# Patient Record
Sex: Female | Born: 1965 | Race: Black or African American | Hispanic: No | Marital: Single | Smoking: Never smoker
Health system: Southern US, Community
[De-identification: ages and names within clinical notes are randomized; demographics above are authoritative.]

## PROBLEM LIST (undated history)

## (undated) DIAGNOSIS — F419 Anxiety disorder, unspecified: Secondary | ICD-10-CM

## (undated) DIAGNOSIS — I82409 Acute embolism and thrombosis of unspecified deep veins of unspecified lower extremity: Secondary | ICD-10-CM

## (undated) DIAGNOSIS — C539 Malignant neoplasm of cervix uteri, unspecified: Secondary | ICD-10-CM

## (undated) HISTORY — PX: LEEP: SHX91

## (undated) HISTORY — PX: HERNIA REPAIR: SHX51

## (undated) HISTORY — PX: ABDOMINAL HYSTERECTOMY: SHX81

---

## 2014-03-18 ENCOUNTER — Encounter (HOSPITAL_BASED_OUTPATIENT_CLINIC_OR_DEPARTMENT_OTHER): Payer: Self-pay | Admitting: *Deleted

## 2014-03-18 ENCOUNTER — Emergency Department (HOSPITAL_BASED_OUTPATIENT_CLINIC_OR_DEPARTMENT_OTHER)
Admission: EM | Admit: 2014-03-18 | Discharge: 2014-03-18 | Disposition: A | Discharge status: Home / Self Care | Payer: Medicaid Other | Attending: Emergency Medicine | Admitting: Emergency Medicine

## 2014-03-18 DIAGNOSIS — F419 Anxiety disorder, unspecified: Secondary | ICD-10-CM | POA: Insufficient documentation

## 2014-03-18 DIAGNOSIS — Z8541 Personal history of malignant neoplasm of cervix uteri: Secondary | ICD-10-CM | POA: Insufficient documentation

## 2014-03-18 DIAGNOSIS — Z86718 Personal history of other venous thrombosis and embolism: Secondary | ICD-10-CM | POA: Diagnosis not present

## 2014-03-18 DIAGNOSIS — Z7901 Long term (current) use of anticoagulants: Secondary | ICD-10-CM | POA: Insufficient documentation

## 2014-03-18 DIAGNOSIS — Z79899 Other long term (current) drug therapy: Secondary | ICD-10-CM | POA: Diagnosis not present

## 2014-03-18 HISTORY — DX: Anxiety disorder, unspecified: F41.9

## 2014-03-18 HISTORY — DX: Acute embolism and thrombosis of unspecified deep veins of unspecified lower extremity: I82.409

## 2014-03-18 HISTORY — DX: Malignant neoplasm of cervix uteri, unspecified: C53.9

## 2014-03-18 MED ORDER — CLONAZEPAM 0.5 MG PO TABS: 0.5000 mg | ORAL_TABLET | Freq: Two times a day (BID) | ORAL | Status: AC | PRN

## 2014-03-18 MED ORDER — LORAZEPAM 1 MG PO TABS
1.0000 mg | ORAL_TABLET | Freq: Once | ORAL | Status: AC
  Administered 2014-03-18: 1 mg via ORAL
  Filled 2014-03-18: qty 1

## 2014-03-18 NOTE — ED Notes (Signed)
Pt reports anxiety attack since New Years Eve- out of klonopin- denies SI/HI- states trying to regulate her breathing

## 2014-03-18 NOTE — Discharge Instructions (Signed)
Panic Attacks Panic attacks are sudden, short-livedsurges of severe anxiety, fear, or discomfort. They may occur for no reason when you are relaxed, when you are anxious, or when you are sleeping. Panic attacks may occur for a number of reasons:   Healthy people occasionally have panic attacks in extreme, life-threatening situations, such as war or natural disasters. Normal anxiety is a protective mechanism of the body that helps us react to danger (fight or flight response).  Panic attacks are often seen with anxiety disorders, such as panic disorder, social anxiety disorder, generalized anxiety disorder, and phobias. Anxiety disorders cause excessive or uncontrollable anxiety. They may interfere with your relationships or other life activities.  Panic attacks are sometimes seen with other mental illnesses, such as depression and posttraumatic stress disorder.  Certain medical conditions, prescription medicines, and drugs of abuse can cause panic attacks. SYMPTOMS  Panic attacks start suddenly, peak within 20 minutes, and are accompanied by four or more of the following symptoms:  Pounding heart or fast heart rate (palpitations).  Sweating.  Trembling or shaking.  Shortness of breath or feeling smothered.  Feeling choked.  Chest pain or discomfort.  Nausea or strange feeling in your stomach.  Dizziness, light-headedness, or feeling like you will faint.  Chills or hot flushes.  Numbness or tingling in your lips or hands and feet.  Feeling that things are not real or feeling that you are not yourself.  Fear of losing control or going crazy.  Fear of dying. Some of these symptoms can mimic serious medical conditions. For example, you may think you are having a heart attack. Although panic attacks can be very scary, they are not life threatening. DIAGNOSIS  Panic attacks are diagnosed through an assessment by your health care provider. Your health care provider will ask  questions about your symptoms, such as where and when they occurred. Your health care provider will also ask about your medical history and use of alcohol and drugs, including prescription medicines. Your health care provider may order blood tests or other studies to rule out a serious medical condition. Your health care provider may refer you to a mental health professional for further evaluation. TREATMENT   Most healthy people who have one or two panic attacks in an extreme, life-threatening situation will not require treatment.  The treatment for panic attacks associated with anxiety disorders or other mental illness typically involves counseling with a mental health professional, medicine, or a combination of both. Your health care provider will help determine what treatment is best for you.  Panic attacks due to physical illness usually go away with treatment of the illness. If prescription medicine is causing panic attacks, talk with your health care provider about stopping the medicine, decreasing the dose, or substituting another medicine.  Panic attacks due to alcohol or drug abuse go away with abstinence. Some adults need professional help in order to stop drinking or using drugs. HOME CARE INSTRUCTIONS   Take all medicines as directed by your health care provider.   Schedule and attend follow-up visits as directed by your health care provider. It is important to keep all your appointments. SEEK MEDICAL CARE IF:  You are not able to take your medicines as prescribed.  Your symptoms do not improve or get worse. SEEK IMMEDIATE MEDICAL CARE IF:   You experience panic attack symptoms that are different than your usual symptoms.  You have serious thoughts about hurting yourself or others.  You are taking medicine for panic attacks and   have a serious side effect. MAKE SURE YOU:  Understand these instructions.  Will watch your condition.  Will get help right away if you are not  doing well or get worse. Document Released: 03/03/2005 Document Revised: 03/08/2013 Document Reviewed: 10/15/2012 Exit[employer] Patient Information 2015 [employer]. This information is not intended to replace advice given to you by your health care provider. Make sure you discuss any questions you have with your health care provider.  Generalized Anxiety Disorder Generalized anxiety disorder (GAD) is a mental disorder. It interferes with life functions, including relationships, work, and school. GAD is different from normal anxiety, which everyone experiences at some point in their lives in response to specific life events and activities. Normal anxiety actually helps us prepare for and get through these life events and activities. Normal anxiety goes away after the event or activity is over.  GAD causes anxiety that is not necessarily related to specific events or activities. It also causes excess anxiety in proportion to specific events or activities. The anxiety associated with GAD is also difficult to control. GAD can vary from mild to severe. People with severe GAD can have intense waves of anxiety with physical symptoms (panic attacks).  SYMPTOMS The anxiety and worry associated with GAD are difficult to control. This anxiety and worry are related to many life events and activities and also occur more days than not for 6 months or longer. People with GAD also have three or more of the following symptoms (one or more in children):  Restlessness.   Fatigue.  Difficulty concentrating.   Irritability.  Muscle tension.  Difficulty sleeping or unsatisfying sleep. DIAGNOSIS GAD is diagnosed through an assessment by your health care provider. Your health care provider will ask you questions aboutyour mood,physical symptoms, and events in your life. Your health care provider may ask you about your medical history and use of alcohol or drugs, including prescription medicines. Your health care  provider may also do a physical exam and blood tests. Certain medical conditions and the use of certain substances can cause symptoms similar to those associated with GAD. Your health care provider may refer you to a mental health specialist for further evaluation. TREATMENT The following therapies are usually used to treat GAD:   Medication. Antidepressant medication usually is prescribed for long-term daily control. Antianxiety medicines may be added in severe cases, especially when panic attacks occur.   Talk therapy (psychotherapy). Certain types of talk therapy can be helpful in treating GAD by providing support, education, and guidance. A form of talk therapy called cognitive behavioral therapy can teach you healthy ways to think about and react to daily life events and activities.  Stress managementtechniques. These include yoga, meditation, and exercise and can be very helpful when they are practiced regularly. A mental health specialist can help determine which treatment is best for you. Some people see improvement with one therapy. However, other people require a combination of therapies. Document Released: 06/28/2012 Document Revised: 07/18/2013 Document Reviewed: 06/28/2012 Exit[employer] Patient Information 2015 [employer]. This information is not intended to replace advice given to you by your health care provider. Make sure you discuss any questions you have with your health care provider.  

## 2014-03-18 NOTE — ED Provider Notes (Signed)
CSN: [id number]     Arrival date & time 03/18/14  1228 History   First MD Initiated Contact with Patient 03/18/14 1241     Chief Complaint  Patient presents with  . Anxiety     (Consider location/radiation/quality/duration/timing/severity/associated sxs/prior Treatment) HPI  49 year old female with a past history of anxiety presents with a recurrent anxiety attacks over the last 2 days. She last used her Klonopin 3 days ago. She typically takes this at least one time per day. Her dose is 0.5 mg as needed. The patient has a long-standing history of anxiety, stating she's had anxiety since she was 49 years old. The patient has her typical symptoms of an anxiety attack including tachypnea and chest tightness. There is no thoughts of hurting herself or hurting someone else. The symptoms always accompany her anxiety attacks and she has them quite frequently. This is no different than typical anxiety attack. She does have a history of DVT and has been taking a Xarelto as directed. No seizures but states she has felt a little shaky. When her anxiety gets this bad she has to go to the ER, occasionally admitted.  Past Medical History  Diagnosis Date  . Anxiety   . 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 Cervical cancer    Past Surgical History  Procedure Laterality Date  . Hernia repair    . Cesarean section    . Leep     No family history on file. History  Substance Use Topics  . Smoking status: Never Smoker   . Smokeless tobacco: Never Used  . Alcohol Use: No   OB History    No data available     Review of Systems  Respiratory: Positive for chest tightness and shortness of breath.   Cardiovascular: Negative for leg swelling.  Psychiatric/Behavioral: Negative for suicidal ideas and self-injury. The patient is nervous/anxious.   All other systems reviewed and are negative.     Allergies  Aspirin and Codeine  Home Medications   Prior to Admission medications   Medication  Sig Start Date End Date Taking? Authorizing Provider  clonazePAM (KLONOPIN) 0.5 MG tablet Take 0.5 mg by mouth daily.   Yes Historical Provider, MD  Rivaroxaban (XARELTO PO) Take by mouth.   Yes Historical Provider, MD   BP 134/90 mmHg  Pulse 84  Temp(Src) 98.3 F (36.8 C) (Oral)  Resp 18  Ht 5\' 7"  (1.702 m)  Wt 145 lb (65.772 kg)  BMI 22.71 kg/m2  SpO2 100%  LMP 03/06/2014 Physical Exam  Constitutional: She is oriented to person, place, and time. She appears well-developed and well-nourished.  HENT:  Head: Normocephalic and atraumatic.  Right Ear: External ear normal.  Left Ear: External ear normal.  Nose: Nose normal.  Eyes: Right eye exhibits no discharge. Left eye exhibits no discharge.  Cardiovascular: Normal rate, regular rhythm and normal heart sounds.   Pulmonary/Chest: Effort normal and breath sounds normal.  Abdominal: Soft. She exhibits no distension. There is no tenderness.  Neurological: She is alert and oriented to person, place, and time.  No significant tremor noted  Skin: Skin is warm and dry. She is not diaphoretic.  Nursing note and vitals reviewed.   ED Course  Procedures (including critical care time) Labs Review Labs Reviewed - No data to display  Imaging Review No results found.   EKG Interpretation   Date/Time:  Saturday March 18 2014 12:44:42 EST Ventricular Rate:  71 PR Interval:  132 QRS Duration: 82 QT  Interval:  376 QTC Calculation: 408 R Axis:   68 Text Interpretation:  Normal sinus rhythm with sinus arrhythmia Normal ECG  No old tracing to compare Confirmed by Jacquelyn Shadrick  MD, Andalyn Heckstall ([id number]) on  03/18/2014 12:47:15 PM      MDM   Final diagnoses:  Anxiety    Patient's symptoms significantly improved after one dose of oral Ativan. Patient does endorse chest tightness and shortness of breath but states this is happened to her many times before and is from her chronic anxiety. With a normal EKG, normal heart rate, and no hypoxia I  have very low suspicion for ACS or pulmonary embolism. This is also less likely given Ativan reduced all of her symptoms tended gone. Will give small refill of Klonopin to help prevent withdrawals but stressed the importance of following up with her PCP who writes these chronically.    Ephraim Hamburger, MD 03/18/14 1426

## 2016-11-26 ENCOUNTER — Encounter (HOSPITAL_BASED_OUTPATIENT_CLINIC_OR_DEPARTMENT_OTHER): Payer: Self-pay | Admitting: *Deleted

## 2016-11-26 ENCOUNTER — Emergency Department (HOSPITAL_BASED_OUTPATIENT_CLINIC_OR_DEPARTMENT_OTHER)
Admission: EM | Admit: 2016-11-26 | Discharge: 2016-11-26 | Disposition: A | Discharge status: Home / Self Care | Payer: Medicaid Other | Attending: Emergency Medicine | Admitting: Emergency Medicine

## 2016-11-26 ENCOUNTER — Emergency Department (HOSPITAL_BASED_OUTPATIENT_CLINIC_OR_DEPARTMENT_OTHER): Payer: Medicaid Other

## 2016-11-26 DIAGNOSIS — S99921A Unspecified injury of right foot, initial encounter: Secondary | ICD-10-CM | POA: Diagnosis present

## 2016-11-26 DIAGNOSIS — Y9301 Activity, walking, marching and hiking: Secondary | ICD-10-CM | POA: Diagnosis not present

## 2016-11-26 DIAGNOSIS — Z79899 Other long term (current) drug therapy: Secondary | ICD-10-CM | POA: Insufficient documentation

## 2016-11-26 DIAGNOSIS — Y999 Unspecified external cause status: Secondary | ICD-10-CM | POA: Insufficient documentation

## 2016-11-26 DIAGNOSIS — S93601A Unspecified sprain of right foot, initial encounter: Secondary | ICD-10-CM | POA: Diagnosis not present

## 2016-11-26 DIAGNOSIS — Y929 Unspecified place or not applicable: Secondary | ICD-10-CM | POA: Insufficient documentation

## 2016-11-26 DIAGNOSIS — W228XXA Striking against or struck by other objects, initial encounter: Secondary | ICD-10-CM | POA: Insufficient documentation

## 2016-11-26 DIAGNOSIS — Z8541 Personal history of malignant neoplasm of cervix uteri: Secondary | ICD-10-CM | POA: Insufficient documentation

## 2016-11-26 MED ORDER — IBUPROFEN 800 MG PO TABS
800.0000 mg | ORAL_TABLET | Freq: Once | ORAL | Status: AC
  Administered 2016-11-26: 800 mg via ORAL
  Filled 2016-11-26: qty 1

## 2016-11-26 MED ORDER — ACETAMINOPHEN 500 MG PO TABS
1000.0000 mg | ORAL_TABLET | Freq: Once | ORAL | Status: AC
  Administered 2016-11-26: 1000 mg via ORAL
  Filled 2016-11-26: qty 2

## 2016-11-26 NOTE — ED Triage Notes (Signed)
Pt reports she injured her right foot cleaning this morning. +deformity right 5th toe. Swelling to right foot noted. Pt reports she has had a blood clot in her foot in the past

## 2016-11-26 NOTE — Discharge Instructions (Signed)
Take 4 over the counter ibuprofen tablets 3 times a day or 2 over-the-counter naproxen tablets twice a day for pain. Also take tylenol 1000mg(2 extra strength) four times a day.    

## 2016-11-26 NOTE — ED Provider Notes (Signed)
[facility] Provider Note   CSN: [id number] Arrival date & time: 11/26/16  6387     History   Chief Complaint Chief Complaint  Patient presents with  . Foot Injury    HPI Julie Newman is a 51 y.o. female.  51 yo F with a cc of right lateral foot injury.  Stubbed on furniture this morning.  Denies other injury.  Pain with ambulation, sharp shooting.  Denies radiation.  She was concerned because she had a blood clot in this leg previously.    The history is provided by the patient.  Foot Injury   The incident occurred less than 1 hour ago. The incident occurred at work. The injury mechanism was a direct blow. The pain is present in the right foot. The quality of the pain is described as sharp and throbbing. The pain is at a severity of 9/10. The pain is moderate. The pain has been constant since onset. Pertinent negatives include no numbness, no inability to bear weight, no loss of motion and no muscle weakness. She reports no foreign bodies present. Nothing aggravates the symptoms. She has tried nothing for the symptoms. The treatment provided no relief.    Past Medical History:  Diagnosis Date  . Anxiety   . Cervical cancer ([place])   . DVT of leg (deep venous thrombosis) ([place]) right    There are no active problems to display for this patient.   Past Surgical History:  Procedure Laterality Date  . CESAREAN SECTION    . HERNIA REPAIR    . LEEP      OB History    No data available       Home Medications    Prior to Admission medications   Medication Sig Start Date End Date Taking? Authorizing Provider  clonazePAM (KLONOPIN) 0.5 MG tablet Take 1 tablet (0.5 mg total) by mouth 2 (two) times daily as needed for anxiety. 03/18/14  Yes Sherwood Gambler, MD  Rivaroxaban (XARELTO PO) Take by mouth.    [provider]    Family History No family history on file.  Social History Social History  Substance Use Topics  . Smoking status: Never Smoker    . Smokeless tobacco: Never Used  . Alcohol use No     Allergies   Aspirin and Codeine   Review of Systems Review of Systems  Constitutional: Negative for chills and fever.  HENT: Negative for congestion and rhinorrhea.   Eyes: Negative for redness and visual disturbance.  Respiratory: Negative for shortness of breath and wheezing.   Cardiovascular: Negative for chest pain and palpitations.  Gastrointestinal: Negative for nausea and vomiting.  Genitourinary: Negative for dysuria and urgency.  Musculoskeletal: Positive for arthralgias and myalgias.  Skin: Negative for pallor and wound.  Neurological: Negative for dizziness, numbness and headaches.     Physical Exam Updated Vital Signs BP (!) 139/91 (BP Location: Right Arm)   Pulse 67   Temp 98.1 F (36.7 C) (Oral)   Resp 18   LMP 03/06/2014   SpO2 100%   Physical Exam  Constitutional: She is oriented to person, place, and time. She appears well-developed and well-nourished. No distress.  HENT:  Head: Normocephalic and atraumatic.  Eyes: Pupils are equal, round, and reactive to light. EOM are normal.  Neck: Normal range of motion. Neck supple.  Cardiovascular: Normal rate and regular rhythm.  Exam reveals no gallop and no friction rub.   No murmur heard. Pulmonary/Chest: Effort normal. She has no wheezes.  She has no rales.  Abdominal: Soft. She exhibits no distension and no mass. There is no tenderness. There is no guarding.  Musculoskeletal: She exhibits tenderness. She exhibits no edema.  TTP about the entire R lateral aspect of the foot.  Mild lateral deviation of the fifth digit compared to the other side.  Mild pain about the right lateral malleolus. Trace edema.  PMS intact to the foot.   Neurological: She is alert and oriented to person, place, and time.  Skin: Skin is warm and dry. She is not diaphoretic.  Psychiatric: She has a normal mood and affect. Her behavior is normal.  Nursing note and vitals  reviewed.    ED Treatments / Results  Labs (all labs ordered are listed, but only abnormal results are displayed) Labs Reviewed - No data to display  EKG  EKG Interpretation None       Radiology Dg Ankle Complete Right  Result Date: 11/26/2016 CLINICAL DATA:  Injury fifth toe today EXAM: RIGHT ANKLE - COMPLETE 3+ VIEW COMPARISON:  None. FINDINGS: There is no evidence of fracture, dislocation, or joint effusion. There is no evidence of arthropathy or other focal bone abnormality. Soft tissues are unremarkable. IMPRESSION: Negative. Electronically Signed   By: Franchot Gallo M.D.   On: 11/26/2016 10:59   Dg Foot Complete Right  Result Date: 11/26/2016 CLINICAL DATA:  Injury fifth toe today EXAM: RIGHT FOOT COMPLETE - 3+ VIEW COMPARISON:  None. FINDINGS: There is no evidence of fracture or dislocation. There is no evidence of arthropathy or other focal bone abnormality. Soft tissues are unremarkable. IMPRESSION: Negative. Electronically Signed   By: Franchot Gallo M.D.   On: 11/26/2016 10:59    Procedures Procedures (including critical care time)  Medications Ordered in ED Medications  acetaminophen (TYLENOL) tablet 1,000 mg (1,000 mg Oral Given 11/26/16 1017)  ibuprofen (ADVIL,MOTRIN) tablet 800 mg (800 mg Oral Given 11/26/16 1017)     Initial Impression / Assessment and Plan / ED Course  I have reviewed the triage vital signs and the nursing notes.  Pertinent labs & imaging results that were available during my care of the patient were reviewed by me and considered in my medical decision making (see chart for details).     51 yo F with a cc of right foot pain after stubbing it on something this morning.  Pain with ambulation.   Xray negative. D/c home.   11:06 AM:  I have discussed the diagnosis/risks/treatment options with the patient and family and believe the pt to be eligible for discharge home to follow-up with PCP. We also discussed returning to the ED immediately if  new or worsening sx occur. We discussed the sx which are most concerning (e.g., sudden worsening pain, fever, inability to tolerate by mouth) that necessitate immediate return. Medications administered to the patient during their visit and any new prescriptions provided to the patient are listed below.  Medications given during this visit Medications  acetaminophen (TYLENOL) tablet 1,000 mg (1,000 mg Oral Given 11/26/16 1017)  ibuprofen (ADVIL,MOTRIN) tablet 800 mg (800 mg Oral Given 11/26/16 1017)     The patient appears reasonably screen and/or stabilized for discharge and I doubt any other medical condition or other [facility] requiring further screening, evaluation, or treatment in the ED at this time prior to discharge.    Final Clinical Impressions(s) / ED Diagnoses   Final diagnoses:  Sprain of right foot, initial encounter    New Prescriptions New Prescriptions   No medications  on file     Deno Etienne, DO 11/26/16 1106

## 2019-07-04 ENCOUNTER — Encounter (HOSPITAL_BASED_OUTPATIENT_CLINIC_OR_DEPARTMENT_OTHER): Payer: Self-pay | Admitting: Emergency Medicine

## 2019-07-04 ENCOUNTER — Other Ambulatory Visit: Payer: Self-pay

## 2019-07-04 ENCOUNTER — Emergency Department (HOSPITAL_BASED_OUTPATIENT_CLINIC_OR_DEPARTMENT_OTHER)
Admission: EM | Admit: 2019-07-04 | Discharge: 2019-07-04 | Disposition: A | Discharge status: Home / Self Care | Payer: No Typology Code available for payment source | Attending: Emergency Medicine | Admitting: Emergency Medicine

## 2019-07-04 ENCOUNTER — Emergency Department (HOSPITAL_BASED_OUTPATIENT_CLINIC_OR_DEPARTMENT_OTHER): Payer: No Typology Code available for payment source

## 2019-07-04 DIAGNOSIS — S5002XA Contusion of left elbow, initial encounter: Secondary | ICD-10-CM | POA: Diagnosis not present

## 2019-07-04 DIAGNOSIS — S4992XA Unspecified injury of left shoulder and upper arm, initial encounter: Secondary | ICD-10-CM | POA: Diagnosis present

## 2019-07-04 DIAGNOSIS — Y92410 Unspecified street and highway as the place of occurrence of the external cause: Secondary | ICD-10-CM | POA: Diagnosis not present

## 2019-07-04 DIAGNOSIS — Y999 Unspecified external cause status: Secondary | ICD-10-CM | POA: Insufficient documentation

## 2019-07-04 DIAGNOSIS — S40012A Contusion of left shoulder, initial encounter: Secondary | ICD-10-CM | POA: Diagnosis not present

## 2019-07-04 DIAGNOSIS — Z8541 Personal history of malignant neoplasm of cervix uteri: Secondary | ICD-10-CM | POA: Diagnosis not present

## 2019-07-04 DIAGNOSIS — Y93I9 Activity, other involving external motion: Secondary | ICD-10-CM | POA: Diagnosis not present

## 2019-07-04 NOTE — ED Notes (Signed)
Pt transported to XR.  
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
## 2019-07-04 NOTE — ED Notes (Signed)
ED Provider at bedside. 

## 2019-07-04 NOTE — ED Notes (Signed)
ED Provider at bedside discussing test results and dispo plan of care. 

## 2020-12-01 ENCOUNTER — Other Ambulatory Visit: Payer: Self-pay

## 2020-12-01 ENCOUNTER — Emergency Department (HOSPITAL_BASED_OUTPATIENT_CLINIC_OR_DEPARTMENT_OTHER)
Admission: EM | Admit: 2020-12-01 | Discharge: 2020-12-01 | Discharge status: Absent without leave | Payer: Medicaid Other

## 2020-12-01 NOTE — ED Notes (Signed)
Pt sts she does not want to be seen; she is waiting for her daughter to pick her up
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
## 2022-02-08 ENCOUNTER — Emergency Department (HOSPITAL_BASED_OUTPATIENT_CLINIC_OR_DEPARTMENT_OTHER)
Admission: EM | Admit: 2022-02-08 | Discharge: 2022-02-08 | Disposition: A | Discharge status: Home / Self Care | Payer: Medicaid Other | Attending: Emergency Medicine | Admitting: Emergency Medicine

## 2022-02-08 ENCOUNTER — Other Ambulatory Visit: Payer: Self-pay

## 2022-02-08 ENCOUNTER — Encounter (HOSPITAL_BASED_OUTPATIENT_CLINIC_OR_DEPARTMENT_OTHER): Payer: Self-pay | Admitting: Emergency Medicine

## 2022-02-08 DIAGNOSIS — T25221A Burn of second degree of right foot, initial encounter: Secondary | ICD-10-CM | POA: Insufficient documentation

## 2022-02-08 DIAGNOSIS — Z23 Encounter for immunization: Secondary | ICD-10-CM | POA: Diagnosis not present

## 2022-02-08 DIAGNOSIS — T25021A Burn of unspecified degree of right foot, initial encounter: Secondary | ICD-10-CM | POA: Diagnosis present

## 2022-02-08 DIAGNOSIS — I10 Essential (primary) hypertension: Secondary | ICD-10-CM | POA: Insufficient documentation

## 2022-02-08 DIAGNOSIS — X158XXA Contact with other hot household appliances, initial encounter: Secondary | ICD-10-CM | POA: Diagnosis not present

## 2022-02-08 DIAGNOSIS — T300 Burn of unspecified body region, unspecified degree: Secondary | ICD-10-CM

## 2022-02-08 MED ORDER — SILVER SULFADIAZINE 1 % EX CREA
TOPICAL_CREAM | Freq: Once | CUTANEOUS | Status: AC
  Administered 2022-02-08: 1 via TOPICAL
  Filled 2022-02-08: qty 85

## 2022-02-08 MED ORDER — OXYCODONE HCL 5 MG PO TABS: 5.0000 mg | ORAL_TABLET | ORAL | 0 refills | Status: AC | PRN

## 2022-02-08 MED ORDER — OXYCODONE-ACETAMINOPHEN 5-325 MG PO TABS
1.0000 | ORAL_TABLET | ORAL | Status: DC | PRN
  Administered 2022-02-08: 1 via ORAL
  Filled 2022-02-08: qty 1

## 2022-02-08 MED ORDER — SILVER SULFADIAZINE 1 % EX CREA: 1.0000 | TOPICAL_CREAM | Freq: Every day | CUTANEOUS | 0 refills | Status: AC

## 2022-02-08 MED ORDER — TETANUS-DIPHTH-ACELL PERTUSSIS 5-2.5-18.5 LF-MCG/0.5 IM SUSY
0.5000 mL | PREFILLED_SYRINGE | Freq: Once | INTRAMUSCULAR | Status: AC
  Administered 2022-02-08: 0.5 mL via INTRAMUSCULAR
  Filled 2022-02-08: qty 0.5
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
